# Patient Record
Sex: Male | Born: 1990 | Race: White | Hispanic: No | Marital: Married | Smoking: Current every day smoker
Health system: Southern US, Community
[De-identification: ages and names within clinical notes are randomized; demographics above are authoritative.]

## PROBLEM LIST (undated history)

## (undated) HISTORY — PX: OTHER SURGICAL HISTORY: SHX169

---

## 2017-10-28 DIAGNOSIS — F17210 Nicotine dependence, cigarettes, uncomplicated: Secondary | ICD-10-CM | POA: Diagnosis not present

## 2017-10-28 DIAGNOSIS — K0889 Other specified disorders of teeth and supporting structures: Secondary | ICD-10-CM | POA: Diagnosis not present

## 2017-11-21 DIAGNOSIS — Z23 Encounter for immunization: Secondary | ICD-10-CM | POA: Diagnosis not present

## 2017-11-21 DIAGNOSIS — T23222A Burn of second degree of single left finger (nail) except thumb, initial encounter: Secondary | ICD-10-CM | POA: Diagnosis not present

## 2018-07-08 ENCOUNTER — Ambulatory Visit
Admission: EM | Admit: 2018-07-08 | Discharge: 2018-07-08 | Disposition: A | Discharge status: Home / Self Care | Payer: 59 | Attending: Family Medicine | Admitting: Family Medicine

## 2018-07-08 ENCOUNTER — Encounter: Payer: Self-pay | Admitting: Emergency Medicine

## 2018-07-08 DIAGNOSIS — J0100 Acute maxillary sinusitis, unspecified: Secondary | ICD-10-CM | POA: Diagnosis not present

## 2018-07-08 MED ORDER — AMOXICILLIN-POT CLAVULANATE 875-125 MG PO TABS: 1.0000 | ORAL_TABLET | Freq: Two times a day (BID) | ORAL | 0 refills | Status: AC

## 2018-07-08 NOTE — ED Provider Notes (Signed)
[facility]   [id number] 07/08/18 Arrival Time: 1542   CC: URI symptoms   SUBJECTIVE: History from: patient.  Ricardo Perry is a 28 y.o. male hx significant for facial reconstruction, who presents with intermittent nasal congestion, sinus pain/ pressure, PND x  1-2 months, and productive cough x 1 day.  Denies sick exposure or precipitating event.  Has tried OTC claritin with minimal relief.  Denies previous symptoms in the past.   Denies fever, chills, fatigue, SOB, wheezing, chest pain, nausea, changes in bowel or bladder habits.   ROS: As per HPI.  History reviewed. No pertinent past medical history. Past Surgical History:  Procedure Laterality Date  . facial reconstruction     No Known Allergies No current facility-administered medications on file prior to encounter.    Current Outpatient Medications on File Prior to Encounter  Medication Sig Dispense Refill  . loratadine (CLARITIN) 10 MG tablet Take 10 mg by mouth daily.     Social History   Socioeconomic History  . Marital status: Married    Spouse name: Not on file  . Number of children: Not on file  . Years of education: Not on file  . Highest education level: Not on file  Occupational History  . Not on file  Social Needs  . Financial resource strain: Not on file  . Food insecurity:    Worry: Not on file    Inability: Not on file  . Transportation needs:    Medical: Not on file    Non-medical: Not on file  Tobacco Use  . Smoking status: Current Some Day Smoker  . Smokeless tobacco: Current User  Substance and Sexual Activity  . Alcohol use: Not Currently  . Drug use: Never  . Sexual activity: Not on file  Lifestyle  . Physical activity:    Days per week: Not on file    Minutes per session: Not on file  . Stress: Not on file  Relationships  . Social connections:    Talks on phone: Not on file    Gets together: Not on file    Attends religious service: Not on file    Active member of club  or organization: Not on file    Attends meetings of clubs or organizations: Not on file    Relationship status: Not on file  . Intimate partner violence:    Fear of current or ex partner: Not on file    Emotionally abused: Not on file    Physically abused: Not on file    Forced sexual activity: Not on file  Other Topics Concern  . Not on file  Social History Narrative  . Not on file   Family History  Problem Relation Age of Onset  . Thyroid disease Mother   . Stroke Father     OBJECTIVE:  Vitals:   07/08/18 1552  BP: 136/87  Pulse: 98  Resp: 16  Temp: 97.8 F (36.6 C)  TempSrc: Oral  SpO2: 95%     General appearance: alert; appears mildlly fatigued, but nontoxic; speaking in full sentences and tolerating own secretions HEENT: NCAT; Ears: EACs clear, TMs pearly gray; Eyes: PERRL.  EOM grossly intact. Sinuses: TTP over maxillary sinuses; Nose: nares patent without rhinorrhea, left turbinate swollen, Throat: oropharynx clear, tonsils non erythematous or enlarged, uvula midline  Neck: supple without LAD Lungs: unlabored respirations, symmetrical air entry; cough: absent; no respiratory distress; CTAB Heart: regular rate and rhythm.  Radial pulses 2+ symmetrical bilaterally Skin: warm and  dry Psychological: alert and cooperative; normal mood and affect  ASSESSMENT & PLAN:  1. Acute non-recurrent maxillary sinusitis     Meds ordered this encounter  Medications  . amoxicillin-clavulanate (AUGMENTIN) 875-125 MG tablet    Sig: Take 1 tablet by mouth every 12 (twelve) hours for 10 days.    Dispense:  20 tablet    Refill:  0    Order Specific Question:   Supervising Provider    Answer:   Eustace Moore [[id number]]   Rest and push fluids You may use OTC zyrtec and/or flonase as needed for symptomatic relief Augmentin prescribed.  Take as directed and to completion Continue with OTC ibuprofen/tylenol as needed for pain Follow up with PCP or with Joaquin Courts FNP if  symptoms persists Return or go to the ED if you have any new or worsening symptoms such as fever, chills, worsening sinus pain/pressure, cough, sore throat, chest pain, shortness of breath, abdominal pain, changes in bowel or bladder habits, etc...   Reviewed expectations re: course of current medical issues. Questions answered. Outlined signs and symptoms indicating need for more acute intervention. Patient verbalized understanding. After Visit Summary given.         Rennis Harding, PA-C 07/08/18 1714

## 2018-07-08 NOTE — ED Triage Notes (Signed)
PT presents to [facility] for assessment of 2 months of nasal congestion, post-nasal drip, and cough.  States the last two days he's woken up sweaty.  Takes OTC allergy medicines.  Headaches intermittently.

## 2018-07-08 NOTE — Discharge Instructions (Signed)
Rest and push fluids You may use OTC zyrtec and/or flonase as needed for symptomatic relief Augmentin prescribed.  Take as directed and to completion Continue with OTC ibuprofen/tylenol as needed for pain Follow up with PCP or with Joaquin Courts FNP if symptoms persists Return or go to the ED if you have any new or worsening symptoms such as fever, chills, worsening sinus pain/pressure, cough, sore throat, chest pain, shortness of breath, abdominal pain, changes in bowel or bladder habits, etc..Marland Kitchen

## 2018-10-29 ENCOUNTER — Other Ambulatory Visit: Payer: Self-pay

## 2018-10-29 ENCOUNTER — Ambulatory Visit
Admission: EM | Admit: 2018-10-29 | Discharge: 2018-10-29 | Disposition: A | Discharge status: Home / Self Care | Payer: 59 | Attending: Family Medicine | Admitting: Family Medicine

## 2018-10-29 ENCOUNTER — Encounter: Payer: Self-pay | Admitting: Emergency Medicine

## 2018-10-29 DIAGNOSIS — L249 Irritant contact dermatitis, unspecified cause: Secondary | ICD-10-CM

## 2018-10-29 MED ORDER — CETIRIZINE HCL 10 MG PO CAPS: 10.0000 mg | ORAL_CAPSULE | Freq: Every day | ORAL | 0 refills | Status: DC

## 2018-10-29 MED ORDER — TRIAMCINOLONE ACETONIDE 0.1 % EX CREA: 1.0000 "application " | TOPICAL_CREAM | Freq: Two times a day (BID) | CUTANEOUS | 0 refills | Status: DC

## 2018-10-29 MED ORDER — PREDNISONE 10 MG (21) PO TBPK: ORAL_TABLET | Freq: Every day | ORAL | 0 refills | Status: DC

## 2018-10-29 NOTE — ED Notes (Signed)
Pt noticed a rash on left hand while mowing the grass x 1 week ago. The rash has spread up left arm, on both knee, on right side. Pt also request prescription allergy medication.

## 2018-10-29 NOTE — ED Notes (Signed)
Patient able to ambulate independently  

## 2018-10-29 NOTE — Discharge Instructions (Addendum)
Please begin taking prednisone beginning with 6 tablets today, decreased by 1 tablet each day-5 tablets tomorrow, 4 tablets on day 3, 3 tablets on day 4, 2 tablets on day 5, 1 tablet on day 6 Take the prednisone with food and in the morning if you are able Take daily Zyrtec/cetirizine, supplement with Benadryl as needed May also use Kenalog cream twice daily for areas still causing significant itching. Please follow-up if rash not resolving with the above, developing worsening or spreading rash, fevers, nausea, vomiting, abdominal pain.

## 2018-10-29 NOTE — ED Triage Notes (Signed)
Pt presents to [facility] for assessment of 1 week of itchy rash that he believed to be "sumac", but now rash has spread full body from left hand/arm,

## 2018-10-29 NOTE — ED Provider Notes (Signed)
[facility]    CSN: [id number] Arrival date & time: 10/29/18  1010      History   Chief Complaint Chief Complaint  Patient presents with  . Rash    HPI [name] is a 28 y.o. male no contributing past medical history presenting today for evaluation of a rash.  Patient states that 1 week ago he started developed a slight rash to his upper extremities.  Over the past couple days he has noticed this rash spreading and is become significantly itching.  It has spread to his trunk and his knees.  Denies involvement of face or in mouth.  He has not taken anything for symptoms.  He believes he was exposed to sumac.  Denies any other known new exposures.  Denies close contacts with similar rash.  HPI  History reviewed. No pertinent past medical history.  There are no active problems to display for this patient.   Past Surgical History:  Procedure Laterality Date  . facial reconstruction         Home Medications    Prior to Admission medications   Medication Sig Start Date End Date Taking? Authorizing Provider  Cetirizine HCl 10 MG CAPS Take 1 capsule (10 mg total) by mouth daily. 10/29/18   Wieters, Hallie C, PA-C  predniSONE (STERAPRED UNI-PAK 21 TAB) 10 MG (21) TBPK tablet Take by mouth daily. Take As directed, begin with 6 tablets on day 1, decrease by 1 tablet each day until you take 1 tablet on day 6 10/29/18   Wieters, Hallie C, PA-C  triamcinolone cream (KENALOG) 0.1 % Apply 1 application topically 2 (two) times daily. 10/29/18   Wieters, Hallie C, PA-C  loratadine (CLARITIN) 10 MG tablet Take 10 mg by mouth daily.  10/29/18  [provider]    Family History Family History  Problem Relation Age of Onset  . Thyroid disease Mother   . Stroke Father     Social History Social History   Tobacco Use  . Smoking status: Current Every Day Smoker    Packs/day: 0.50  . Smokeless tobacco: Current User  Substance Use Topics  . Alcohol use: Not  Currently  . Drug use: Never     Allergies   Patient has no known allergies.   Review of Systems Review of Systems  Constitutional: Negative for fatigue and fever.  Eyes: Negative for redness, itching and visual disturbance.  Respiratory: Negative for shortness of breath.   Cardiovascular: Negative for chest pain and leg swelling.  Gastrointestinal: Negative for nausea and vomiting.  Musculoskeletal: Negative for arthralgias and myalgias.  Skin: Positive for color change and rash. Negative for wound.  Neurological: Negative for dizziness, syncope, weakness, light-headedness and headaches.     Physical Exam Triage Vital Signs ED Triage Vitals  Enc Vitals Group     BP 10/29/18 1017 138/80     Pulse Rate 10/29/18 1017 70     Resp 10/29/18 1017 16     Temp 10/29/18 1017 98 F (36.7 C)     Temp Source 10/29/18 1017 Oral     SpO2 10/29/18 1017 96 %     Weight --      Height --      Head Circumference --      Peak Flow --      Pain Score 10/29/18 1027 0     Pain Loc --      Pain Edu? --      Excl. in GC? --  No data found.  Updated Vital Signs BP 138/80 (BP Location: Left Arm)   Pulse 70   Temp 98 F (36.7 C) (Oral)   Resp 16   SpO2 96%   Visual Acuity Right Eye Distance:   Left Eye Distance:   Bilateral Distance:    Right Eye Near:   Left Eye Near:    Bilateral Near:     Physical Exam Vitals signs and nursing note reviewed.  Constitutional:      Appearance: He is well-developed.     Comments: No acute distress  HENT:     Head: Normocephalic and atraumatic.     Nose: Nose normal.     Mouth/Throat:     Comments: Oral mucosa pink and moist, no tonsillar enlargement or exudate. Posterior pharynx patent and nonerythematous, no uvula deviation or swelling. Normal phonation. No lesions noted on oral mucosa Eyes:     Conjunctiva/sclera: Conjunctivae normal.  Neck:     Musculoskeletal: Neck supple.  Cardiovascular:     Rate and Rhythm: Normal rate.   Pulmonary:     Effort: Pulmonary effort is normal. No respiratory distress.  Abdominal:     General: There is no distension.  Musculoskeletal: Normal range of motion.  Skin:    General: Skin is warm and dry.     Comments: Erythematous papular bumps noted to bilateral upper extremities, bilateral flanks and lower back  Neurological:     Mental Status: He is alert and oriented to person, place, and time.      UC Treatments / Results  Labs (all labs ordered are listed, but only abnormal results are displayed) Labs Reviewed - No data to display  EKG None  Radiology No results found.  Procedures Procedures (including critical care time)  Medications Ordered in UC Medications - No data to display  Initial Impression / Assessment and Plan / UC Course  I have reviewed the triage vital signs and the nursing notes.  Pertinent labs & imaging results that were available during my care of the patient were reviewed by me and considered in my medical decision making (see chart for details).     Patient appears to have a contact dermatitis, possible plant exposure.  No other new exposures noted.  Will place on prednisone taper x6 days.  Recommending antihistamines.  Kenalog cream to use in areas of significant itching.  No systemic symptoms, vital signs stable.  Follow-up if rash not resolving or worsening.Discussed strict return precautions. Patient verbalized understanding and is agreeable with plan.  Final Clinical Impressions(s) / UC Diagnoses   Final diagnoses:  Irritant contact dermatitis, unspecified trigger     Discharge Instructions     Please begin taking prednisone beginning with 6 tablets today, decreased by 1 tablet each day-5 tablets tomorrow, 4 tablets on day 3, 3 tablets on day 4, 2 tablets on day 5, 1 tablet on day 6 Take the prednisone with food and in the morning if you are able Take daily Zyrtec/cetirizine, supplement with Benadryl as needed May also use  Kenalog cream twice daily for areas still causing significant itching. Please follow-up if rash not resolving with the above, developing worsening or spreading rash, fevers, nausea, vomiting, abdominal pain.   ED Prescriptions    Medication Sig Dispense Auth. Provider   Cetirizine HCl 10 MG CAPS Take 1 capsule (10 mg total) by mouth daily. 30 capsule Wieters, Hallie C, PA-C   predniSONE (STERAPRED UNI-PAK 21 TAB) 10 MG (21) TBPK tablet Take by mouth daily.  Take As directed, begin with 6 tablets on day 1, decrease by 1 tablet each day until you take 1 tablet on day 6 21 tablet Wieters, Hallie C, PA-C   triamcinolone cream (KENALOG) 0.1 % Apply 1 application topically 2 (two) times daily. 45 g Wieters, [place] C, PA-C     Controlled Substance Prescriptions [place] Controlled Substance Registry consulted? Not Applicable   Lew DawesWieters, Hallie C, [place]PA-C 10/29/18 1411

## 2019-10-26 ENCOUNTER — Emergency Department (HOSPITAL_COMMUNITY): Payer: 59

## 2019-10-26 ENCOUNTER — Encounter (HOSPITAL_COMMUNITY): Payer: Self-pay | Admitting: Emergency Medicine

## 2019-10-26 ENCOUNTER — Other Ambulatory Visit: Payer: Self-pay

## 2019-10-26 ENCOUNTER — Emergency Department (HOSPITAL_COMMUNITY)
Admission: EM | Admit: 2019-10-26 | Discharge: 2019-10-27 | Disposition: A | Discharge status: Home / Self Care | Payer: 59 | Attending: Emergency Medicine | Admitting: Emergency Medicine

## 2019-10-26 DIAGNOSIS — S93491A Sprain of other ligament of right ankle, initial encounter: Secondary | ICD-10-CM | POA: Diagnosis not present

## 2019-10-26 DIAGNOSIS — Y92411 Interstate highway as the place of occurrence of the external cause: Secondary | ICD-10-CM | POA: Diagnosis not present

## 2019-10-26 DIAGNOSIS — S99911A Unspecified injury of right ankle, initial encounter: Secondary | ICD-10-CM | POA: Diagnosis present

## 2019-10-26 DIAGNOSIS — Y93I9 Activity, other involving external motion: Secondary | ICD-10-CM | POA: Insufficient documentation

## 2019-10-26 DIAGNOSIS — Y999 Unspecified external cause status: Secondary | ICD-10-CM | POA: Diagnosis not present

## 2019-10-26 DIAGNOSIS — Z79899 Other long term (current) drug therapy: Secondary | ICD-10-CM | POA: Insufficient documentation

## 2019-10-26 DIAGNOSIS — F17210 Nicotine dependence, cigarettes, uncomplicated: Secondary | ICD-10-CM | POA: Insufficient documentation

## 2019-10-26 DIAGNOSIS — M542 Cervicalgia: Secondary | ICD-10-CM | POA: Diagnosis not present
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
## 2019-10-27 ENCOUNTER — Emergency Department (HOSPITAL_COMMUNITY): Payer: 59

## 2019-10-27 MED ORDER — IBUPROFEN 400 MG PO TABS
600.0000 mg | ORAL_TABLET | Freq: Once | ORAL | Status: AC
  Administered 2019-10-27: 600 mg via ORAL
  Filled 2019-10-27: qty 1

## 2019-10-27 MED ORDER — OXYCODONE-ACETAMINOPHEN 5-325 MG PO TABS
1.0000 | ORAL_TABLET | ORAL | Status: DC | PRN
  Administered 2019-10-27: 1 via ORAL
  Filled 2019-10-27: qty 1

## 2019-10-27 MED ORDER — CYCLOBENZAPRINE HCL 10 MG PO TABS: 10.0000 mg | ORAL_TABLET | Freq: Once | ORAL | Status: DC

## 2019-10-27 MED ORDER — ACETAMINOPHEN 500 MG PO TABS: 1000.0000 mg | ORAL_TABLET | Freq: Once | ORAL | Status: DC

## 2019-10-27 MED ORDER — BACLOFEN 10 MG PO TABS: 10.0000 mg | ORAL_TABLET | Freq: Two times a day (BID) | ORAL | 0 refills | Status: DC | PRN

## 2019-10-27 NOTE — ED Notes (Signed)
Pt verbalized understanding of d/c instructions, follow up and medications.  Pt to WR via WC with family. NAD

## 2019-10-27 NOTE — ED Notes (Signed)
Ortho to come apply brace

## 2019-10-27 NOTE — Progress Notes (Signed)
Orthopedic Tech Progress Note Patient Details:  Ricardo Perry [birth year] [id number]  Ortho Devices Type of Ortho Device: ASO Ortho Device/Splint Location: LRE Ortho Device/Splint Interventions: Application, Ordered   Post Interventions Patient Tolerated: Well Instructions Provided: Care of device   Khriz Liddy A Ceyda Peterka 10/27/2019, 2:53 PM

## 2019-10-27 NOTE — ED Notes (Signed)
Pt sitting outside. 
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
## 2020-10-16 ENCOUNTER — Ambulatory Visit
Admission: EM | Admit: 2020-10-16 | Discharge: 2020-10-16 | Disposition: A | Discharge status: Home / Self Care | Payer: 59 | Attending: Family Medicine | Admitting: Family Medicine

## 2020-10-16 ENCOUNTER — Other Ambulatory Visit: Payer: Self-pay

## 2020-10-16 DIAGNOSIS — L089 Local infection of the skin and subcutaneous tissue, unspecified: Secondary | ICD-10-CM

## 2020-10-16 DIAGNOSIS — W57XXXA Bitten or stung by nonvenomous insect and other nonvenomous arthropods, initial encounter: Secondary | ICD-10-CM | POA: Diagnosis not present

## 2020-10-16 DIAGNOSIS — S90561A Insect bite (nonvenomous), right ankle, initial encounter: Secondary | ICD-10-CM

## 2020-10-16 MED ORDER — DOXYCYCLINE HYCLATE 100 MG PO CAPS: 100.0000 mg | ORAL_CAPSULE | Freq: Two times a day (BID) | ORAL | 0 refills | Status: AC

## 2020-10-16 NOTE — ED Triage Notes (Signed)
Pt states removed a tick from rt ankle last Thursday. States now having redness and raised for the past 3 days.

## 2020-10-16 NOTE — ED Provider Notes (Signed)
[facility]    CSN: [id number] Arrival date & time: 10/16/20  1649      History   Chief Complaint No chief complaint on file.   HPI Ricardo Perry is a 30 y.o. male.   HPI Patient presents today for evaluation of a rash related to a recent tick bite.  Patient reports that he removed an intact tick bite from the right ankle approximately 5 days ago.  He has not had fever however has noticed that the redness of the rash is increased over the last few days and yesterday was swollen.   No past medical history on file.  There are no problems to display for this patient.   Past Surgical History:  Procedure Laterality Date   facial reconstruction         Home Medications    Prior to Admission medications   Medication Sig Start Date End Date Taking? Authorizing Provider  baclofen (LIORESAL) 10 MG tablet Take 1 tablet (10 mg total) by mouth 2 (two) times daily as needed for muscle spasms. 10/27/19 10/26/20  Allayne Stack, DO  Cetirizine HCl 10 MG CAPS Take 1 capsule (10 mg total) by mouth daily. 10/29/18   Wieters, Hallie C, PA-C  predniSONE (STERAPRED UNI-PAK 21 TAB) 10 MG (21) TBPK tablet Take by mouth daily. Take As directed, begin with 6 tablets on day 1, decrease by 1 tablet each day until you take 1 tablet on day 6 10/29/18   Wieters, Hallie C, PA-C  triamcinolone cream (KENALOG) 0.1 % Apply 1 application topically 2 (two) times daily. 10/29/18   Wieters, Hallie C, PA-C  loratadine (CLARITIN) 10 MG tablet Take 10 mg by mouth daily.  10/29/18  [provider]    Family History Family History  Problem Relation Age of Onset   Thyroid disease Mother    Stroke Father     Social History Social History   Tobacco Use   Smoking status: Every Day    Packs/day: 0.50    Pack years: 0.00    Types: Cigarettes   Smokeless tobacco: Current  Substance Use Topics   Alcohol use: Not Currently   Drug use: Never     Allergies   Patient has no known  allergies.   Review of Systems Review of Systems Pertinent negatives listed in HPI   Physical Exam Triage Vital Signs ED Triage Vitals  Enc Vitals Group     BP      Pulse      Resp      Temp      Temp src      SpO2      Weight      Height      Head Circumference      Peak Flow      Pain Score      Pain Loc      Pain Edu?      Excl. in GC?    No data found.  Updated Vital Signs There were no vitals taken for this visit.  Visual Acuity Right Eye Distance:   Left Eye Distance:   Bilateral Distance:    Right Eye Near:   Left Eye Near:    Bilateral Near:     Physical Exam Constitutional:      Appearance: Normal appearance.  Cardiovascular:     Rate and Rhythm: Normal rate.  Pulmonary:     Effort: Pulmonary effort is normal.  Musculoskeletal:       Legs:  Skin:    Capillary Refill: Capillary refill takes less than 2 seconds.     Findings: Erythema and rash present.  Neurological:     Mental Status: He is alert.  Psychiatric:        Mood and Affect: Mood normal.        Behavior: Behavior normal.        Thought Content: Thought content normal.        Judgment: Judgment normal.     UC Treatments / Results  Labs (all labs ordered are listed, but only abnormal results are displayed) Labs Reviewed - No data to display  EKG   Radiology No results found.  Procedures Procedures (including critical care time)  Medications Ordered in UC Medications - No data to display  Initial Impression / Assessment and Plan / UC Course  I have reviewed the triage vital signs and the nursing notes.  Pertinent labs & imaging results that were available during my care of the patient were reviewed by me and considered in my medical decision making (see chart for details).    Treating for prophylaxis against tickborne illness along with treatment of an infected insect bite.  Treatment with doxycycline 100 mg twice daily x7 days.  Red flags precautions warranting  return for evaluation discussed.  Patient verbalized understanding agreement plan  Final Clinical Impressions(s) / UC Diagnoses   Final diagnoses:  Infected insect bite of ankle, right, initial encounter   Discharge Instructions   None    ED Prescriptions     Medication Sig Dispense Auth. Provider   doxycycline (VIBRAMYCIN) 100 MG capsule Take 1 capsule (100 mg total) by mouth 2 (two) times daily for 7 days. 14 capsule Bing Neighbors, FNP      PDMP not reviewed this encounter.   Bing Neighbors, FNP 10/16/20 1948
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
